# Patient Record
Sex: Male | Born: 1950 | Race: White | Hispanic: No | Smoking: Current every day smoker
Health system: Southern US, Community
[De-identification: ages and names within clinical notes are randomized; demographics above are authoritative.]

## PROBLEM LIST (undated history)

## (undated) HISTORY — PX: CHOLECYSTECTOMY: SHX55

---

## 2014-07-10 ENCOUNTER — Emergency Department (HOSPITAL_COMMUNITY): Payer: Medicaid Other

## 2014-07-10 ENCOUNTER — Emergency Department (HOSPITAL_COMMUNITY)
Admission: EM | Admit: 2014-07-10 | Discharge: 2014-07-10 | Disposition: A | Discharge status: Home / Self Care | Payer: Medicaid Other | Attending: Emergency Medicine | Admitting: Emergency Medicine

## 2014-07-10 ENCOUNTER — Encounter (HOSPITAL_COMMUNITY): Payer: Self-pay

## 2014-07-10 DIAGNOSIS — S0993XA Unspecified injury of face, initial encounter: Secondary | ICD-10-CM | POA: Diagnosis present

## 2014-07-10 DIAGNOSIS — S79912A Unspecified injury of left hip, initial encounter: Secondary | ICD-10-CM | POA: Diagnosis not present

## 2014-07-10 DIAGNOSIS — S59901A Unspecified injury of right elbow, initial encounter: Secondary | ICD-10-CM | POA: Diagnosis not present

## 2014-07-10 DIAGNOSIS — Y9339 Activity, other involving climbing, rappelling and jumping off: Secondary | ICD-10-CM | POA: Diagnosis not present

## 2014-07-10 DIAGNOSIS — S79911A Unspecified injury of right hip, initial encounter: Secondary | ICD-10-CM | POA: Insufficient documentation

## 2014-07-10 DIAGNOSIS — S01511A Laceration without foreign body of lip, initial encounter: Secondary | ICD-10-CM

## 2014-07-10 DIAGNOSIS — S0285XA Fracture of orbit, unspecified, initial encounter for closed fracture: Secondary | ICD-10-CM

## 2014-07-10 DIAGNOSIS — T1490XA Injury, unspecified, initial encounter: Secondary | ICD-10-CM

## 2014-07-10 DIAGNOSIS — S02401A Maxillary fracture, unspecified, initial encounter for closed fracture: Secondary | ICD-10-CM | POA: Diagnosis not present

## 2014-07-10 DIAGNOSIS — W14XXXA Fall from tree, initial encounter: Secondary | ICD-10-CM | POA: Insufficient documentation

## 2014-07-10 DIAGNOSIS — S299XXA Unspecified injury of thorax, initial encounter: Secondary | ICD-10-CM | POA: Diagnosis not present

## 2014-07-10 DIAGNOSIS — S99911A Unspecified injury of right ankle, initial encounter: Secondary | ICD-10-CM | POA: Insufficient documentation

## 2014-07-10 DIAGNOSIS — Y9289 Other specified places as the place of occurrence of the external cause: Secondary | ICD-10-CM | POA: Diagnosis not present

## 2014-07-10 DIAGNOSIS — S023XXA Fracture of orbital floor, initial encounter for closed fracture: Secondary | ICD-10-CM | POA: Diagnosis not present

## 2014-07-10 DIAGNOSIS — S92002A Unspecified fracture of left calcaneus, initial encounter for closed fracture: Secondary | ICD-10-CM

## 2014-07-10 DIAGNOSIS — Z720 Tobacco use: Secondary | ICD-10-CM | POA: Diagnosis not present

## 2014-07-10 DIAGNOSIS — Y990 Civilian activity done for income or pay: Secondary | ICD-10-CM | POA: Diagnosis not present

## 2014-07-10 LAB — COMPREHENSIVE METABOLIC PANEL
ALK PHOS: 107 U/L (ref 39–117)
ALT: 52 U/L (ref 0–53)
AST: 42 U/L — ABNORMAL HIGH (ref 0–37)
Albumin: 3.6 g/dL (ref 3.5–5.2)
Anion gap: 9 (ref 5–15)
BILIRUBIN TOTAL: 0.5 mg/dL (ref 0.3–1.2)
BUN: 12 mg/dL (ref 6–23)
CALCIUM: 8.8 mg/dL (ref 8.4–10.5)
CHLORIDE: 109 mmol/L (ref 96–112)
CO2: 21 mmol/L (ref 19–32)
Creatinine, Ser: 0.85 mg/dL (ref 0.50–1.35)
GLUCOSE: 165 mg/dL — AB (ref 70–99)
Potassium: 4.4 mmol/L (ref 3.5–5.1)
SODIUM: 139 mmol/L (ref 135–145)
Total Protein: 6.8 g/dL (ref 6.0–8.3)

## 2014-07-10 LAB — CBC WITH DIFFERENTIAL/PLATELET
BASOS ABS: 0.1 10*3/uL (ref 0.0–0.1)
Basophils Relative: 0 % (ref 0–1)
EOS PCT: 3 % (ref 0–5)
Eosinophils Absolute: 0.5 10*3/uL (ref 0.0–0.7)
HEMATOCRIT: 45.5 % (ref 39.0–52.0)
Hemoglobin: 15.5 g/dL (ref 13.0–17.0)
LYMPHS ABS: 4 10*3/uL (ref 0.7–4.0)
Lymphocytes Relative: 24 % (ref 12–46)
MCH: 29.4 pg (ref 26.0–34.0)
MCHC: 34.1 g/dL (ref 30.0–36.0)
MCV: 86.3 fL (ref 78.0–100.0)
MONO ABS: 1.3 10*3/uL — AB (ref 0.1–1.0)
Monocytes Relative: 8 % (ref 3–12)
Neutro Abs: 10.4 10*3/uL — ABNORMAL HIGH (ref 1.7–7.7)
Neutrophils Relative %: 65 % (ref 43–77)
Platelets: 290 10*3/uL (ref 150–400)
RBC: 5.27 MIL/uL (ref 4.22–5.81)
RDW: 15 % (ref 11.5–15.5)
WBC: 16.3 10*3/uL — ABNORMAL HIGH (ref 4.0–10.5)

## 2014-07-10 LAB — ABO/RH: ABO/RH(D): A POS

## 2014-07-10 LAB — TYPE AND SCREEN
ABO/RH(D): A POS
Antibody Screen: NEGATIVE

## 2014-07-10 MED ORDER — LIDOCAINE HCL 1 % IJ SOLN
10.0000 mL | Freq: Once | INTRAMUSCULAR | Status: DC
  Filled 2014-07-10: qty 10

## 2014-07-10 MED ORDER — LIDOCAINE HCL (PF) 1 % IJ SOLN
INTRAMUSCULAR | Status: DC
Start: 2014-07-10 — End: 2014-07-10
  Filled 2014-07-10: qty 5

## 2014-07-10 MED ORDER — DOCUSATE SODIUM 100 MG PO CAPS: 100.0000 mg | ORAL_CAPSULE | Freq: Two times a day (BID) | ORAL | Status: AC

## 2014-07-10 MED ORDER — FENTANYL CITRATE 0.05 MG/ML IJ SOLN
100.0000 ug | Freq: Once | INTRAMUSCULAR | Status: AC
  Administered 2014-07-10: 100 ug via INTRAVENOUS
  Filled 2014-07-10: qty 2

## 2014-07-10 MED ORDER — NAPROXEN 500 MG PO TABS: 500.0000 mg | ORAL_TABLET | Freq: Two times a day (BID) | ORAL | Status: AC

## 2014-07-10 MED ORDER — OXYCODONE-ACETAMINOPHEN 5-325 MG PO TABS: 1.0000 | ORAL_TABLET | ORAL | Status: AC | PRN

## 2014-07-10 MED ORDER — CEPHALEXIN 500 MG PO CAPS: 500.0000 mg | ORAL_CAPSULE | Freq: Three times a day (TID) | ORAL | Status: AC

## 2014-07-10 MED ORDER — FENTANYL CITRATE 0.05 MG/ML IJ SOLN
100.0000 ug | INTRAMUSCULAR | Status: AC
  Administered 2014-07-10: 100 ug via INTRAVENOUS
  Filled 2014-07-10: qty 2

## 2014-07-10 NOTE — ED Notes (Signed)
Ortho at bedside to consult, to be splint placed to L lower extremity.

## 2014-07-10 NOTE — ED Notes (Signed)
Pt. Attempting to use urinal, unsuccessful at this time

## 2014-07-10 NOTE — ED Notes (Signed)
Ortho at bedside.
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
## 2014-07-10 NOTE — Progress Notes (Signed)
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  Patient indicated that he climbed tree without securing safety line he  cut out and fell. Patient is alert .  Patient ask that I call a friend Verline Lema( David Luck [phone]) and inform him that he was here in ED.  Call was made and friend is in route to hospital. Provided emotional support and information sharing between staff and patient.  Will follow as needed.   07/10/14 1000  Clinical Encounter Type  Visited With Patient;Health care provider  Visit Type Initial;Spiritual support;ED;Trauma  Referral From Nurse  Spiritual Encounters  Spiritual Needs Emotional  Stress Factors  Patient Stress Factors None identified  Venida JarvisWatlington, Leodis Alcocer, Chaplain,pager [phone]
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
## 2014-07-10 NOTE — Progress Notes (Signed)
Orthopedic Tech Progress Note Patient Details:  Frank Snow 03/24/1951 [id number]  Ortho Devices Type of Ortho Device: Ace wrap, Post (short leg) splint, Stirrup splint, Crutches Ortho Device/Splint Location: LLE Ortho Device/Splint Interventions: Ordered, Application   Jennye MoccasinHughes, Frank Snow 07/10/2014, 3:45 PM

## 2014-07-10 NOTE — ED Notes (Signed)
Pt placed in gown, on continuous pulse oximetry and blood pressure cuff; warm blankets given; Aspen collar applied and LSB removed by Hyacinth MeekerMiller, MD

## 2014-07-10 NOTE — Discharge Instructions (Signed)
Please call your doctor for a followup appointment within 24-48 hours. When you talk to your doctor please let them know that you were seen in the emergency department and have them acquire all of your records so that they can discuss the findings with you and formulate a treatment plan to fully care for your new and ongoing problems. ° ° °Emergency Department Resource Guide °1) Find a Doctor and Pay Out of Pocket °Although you won't have to find out who is covered by your insurance plan, it is a good idea to ask around and get recommendations. You will then need to call the office and see if the doctor you have chosen will accept you as a new patient and what types of options they offer for patients who are self-pay. Some doctors offer discounts or will set up payment plans for their patients who do not have insurance, but you will need to ask so you aren't surprised when you get to your appointment. ° °2) Contact Your Local Health Department °Not all health departments have doctors that can see patients for sick visits, but many do, so it is worth a call to see if yours does. If you don't know where your local health department is, you can check in your phone book. The CDC also has a tool to help you locate your state's health department, and many state websites also have listings of all of their local health departments. ° °3) Find a Walk-in Clinic °If your illness is not likely to be very severe or complicated, you may want to try a walk in clinic. These are popping up all over the country in pharmacies, drugstores, and shopping centers. They're usually staffed by nurse practitioners or physician assistants that have been trained to treat common illnesses and complaints. They're usually fairly quick and inexpensive. However, if you have serious medical issues or chronic medical problems, these are probably not your best option. ° °No Primary Care Doctor: °- Call [facility] at  832-8000 - they can help you  locate a primary care doctor that  accepts your insurance, provides certain services, etc. °- Physician Referral Service- 1-800-533-3463 ° °Chronic Pain Problems: °Organization         Address  Phone   Notes  °[facility]  (336) 297-2271 Patients need to be referred by their primary care doctor.  ° °Medication Assistance: °Organization         Address  Phone   Notes  °[place] [facility] [address] °[place], [place] [address] (336) 641-8030 --Must be a resident of [place] °-- Must have NO insurance coverage whatsoever (no Medicaid/ Medicare, etc.) °-- The pt. MUST have a primary care doctor that directs their care regularly and follows them in the community °  °[facility]  (866) 331-1348   °[employer]  (888) 892-1162   ° °Agencies that provide inexpensive medical care: °Organization         Address  Phone   Notes  °[facility]  (336) 832-8035   °[facility]    (336) 832-7272   °[facility] [address] °[place], [place] [address] (336) 832-4777   °[facility] [address], °[place] (336) 271-4999   °[facility]    (336) 373-0678   °[facility]    (336) 272-1050   °[facility] ° [address], [place] Phone:  (336)   832-4444, Fax:  (336) 832-4440 Hours of Operation:  9 am - 6 pm, M-F.  Also accepts Medicaid/Medicare and self-pay.  °[facility] ° [address], [place] Phone: (336) 832-3150, Fax: (336) 832-3151. Hours of Operation:  8:30 am - 5:30 pm, M-F.  Also accepts Medicaid and self-pay.  °[facility] [place] [address], [place] Phone: (336) 878-6027   °[facility] [address], [place], [place] (336)723-1848, Ext. 123 Mondays & Thursdays: 7-9 AM.  First 15 patients are seen on a first come, first serve basis. °  ° °Medicaid-accepting [place]  Providers: ° °Organization         Address  Phone   Notes  °[facility] [address], [place] (336) 641-2100 Also accepts self-pay patients.  °[facility] [address], [place] ° (336) 856-9996   °[facility] [address], [place] (336) 288-8857   °[facility] [address] [place] [address], [place] (336) 299-7000   °Veita Bland [address], [place]  ° (336) 373-1557 Only accepts [place] Access Medicaid patients after they have their name applied to their card.  ° °Self-Pay (no insurance) in [place]: ° °Organization         Address  Phone   Notes  °Sickle Cell Patients, [facility] [address], [place] (336) 832-1970   °[facility] [address], [place] (336) 832-4400   °[facility] ° [address], [place] (336) 992-4800   °[facility]/Dr. Osei-Bonsu ° [address] [place] [address], [place] or [address], [place] (336) [phone] Phone number for both [place] and [place] locations is the same.  °[facility] [address], [place] (336) 299-0000   °[facility] [address] [place] [address], [place] or [address] (336) 852-7530 °(336) 878-2260   °[facility] [address], [place] (336) 350-1642, phone; (336) 294-5005, fax Sees patients 1st and 3rd Saturday of every month.  Must not qualify for public or private insurance (i.e. Medicaid, Medicare, [place] Health Choice, Veterans' Benefits) • Household income should be no more than 200% of the poverty level •The clinic cannot treat you if you are pregnant or think you are pregnant • Sexually transmitted diseases are not treated at the clinic.  ° ° °Dental Care: °Organization         Address  Phone  Notes  °[facility] [address], [place] (336) 641-6152 Accepts children up to age 21 who are enrolled in Medicaid or [place] Health Choice; pregnant women with a Medicaid card; and children who have applied for Medicaid or [place] Health Choice, but were declined, whose parents can pay a reduced fee at time of service.  °[facility] [place]  [address], [place] (336) 641-7733 Accepts children up to age 21 who are enrolled in Medicaid or [place] Health Choice; pregnant women with a Medicaid card; and children who have applied for Medicaid or [place] Health Choice, but were declined, whose parents can pay a reduced fee at time of service.  °[facility] ° [address], [place] (336) 641-4533 Patients are seen by appointment only. Walk-ins are   not accepted. [facility] will see patients 18 years of age and older. °Monday - Tuesday (8am-5pm) °Most Wednesdays (8:30-5pm) °$30 per visit, cash only  °[facility] ° [address], [place] (336) 641-4533 Patients are seen by appointment only. Walk-ins are not accepted. [facility] will see patients 18 years of age and older. °One Wednesday Evening (Monthly: Volunteer Based).  $30 per visit, cash only  °[facility]  (919) 537-3737 for adults; Children under age 4, call Graduate Pediatric Dentistry at ([phone]) [phone]. Children aged 4-14, please call ([phone]) [phone] to request a pediatric application. ° Dental services are provided in all areas of dental care including fillings, crowns and bridges, complete and partial dentures, implants, gum treatment, root canals, and extractions. Preventive care is also provided. Treatment is provided to both adults and children. °Patients are selected via a lottery and there is often a waiting list. °  °[facility] [address], °[place] ° (336) 763-8833 [email or web address] °  °[facility]  [address], [place], [place] (336)723-1848, Ext. 123 Second and Fourth Thursday of each month, opens at 6:30 AM; Clinic ends at 9 AM.  Patients are seen on a first-come first-served basis, and a limited number are seen during each clinic.  ° °[facility] ° [address], [place], [place] (336) 723-7904   Eligibility Requirements °You must have lived in [place], [place], or [place] counties for at least the last three months. °  You cannot be eligible for state or federal sponsored healthcare insurance, including Veterans Administration, Medicaid, or Medicare. °  You generally cannot be eligible for healthcare insurance through your employer.  °  How to apply: °Eligibility screenings are held every Tuesday and Wednesday afternoon from 1:00 pm until 4:00 pm. You do not need an appointment for the interview!  °[facility] [address], [place], [place] 336-631-2330   °[facility]  336-342-8273   °[facility]  336-703-3100   °[facility]  336-570-6415   ° °Behavioral Health Resources in the Community: °Intensive Outpatient Programs °Organization         Address  Phone  Notes  °[facility] [address], [place], [place] 336-878-6098   °[facility] [address], [place], [place] 336-832-9800   °[facility] [address], [place], [place] ° 336-882-2125   °[facility] [address],  °[place], [place] 1-800-853-5163 or 336-641-4981   °Substance Abuse Resources °Organization         Address  Phone  Notes  °[facility]  336-882-2125   °[facility]  336-784-9470   °[facility]  336-285-9073   °[facility]  336-845-3988   °Residential & Outpatient Substance Abuse Program  1-800-659-3381   °Psychological Services °Organization         Address  Phone  Notes  °[facility]  336- 832-9600     °[facility]  336- 378-7881   °[facility] [address], [place] 1-800-853-5163 or 336-641-4981   ° °Mobile Crisis Teams °Organization         Address  Phone  Notes  °[facility], Mobile Crisis Care Unit  1-877-626-1772   °Assertive °[facility] ° [address] [place], [place] 336-834-9664   °Sharon DeEsch [address] °[place] 336-554-5454   ° °  Self-Help/Support Groups °Organization         Address  Phone             Notes  °[facility] - variety of support groups  336- 373-1402 Call for more information  °Narcotics Anonymous (NA), [facility] [address], °[place]  2 meetings at this location  ° °Residential Treatment Programs °Organization         Address  Phone  Notes  °[facility] [address],    °[place]  1-866-801-8205   °[facility] ° [address], [place], [place] 704-293-8524   °[facility] [address], [place] 336-845-3988 Admissions: 8am-3pm M-F  °[facility] [address]    °[place], [place] 336-841-1104   °[facility] [address], [place], [place] 336-379-7146   °[facility] [address]  °[place], [place] 336-285-9073   °[facility] Intensive Outpatient [address], [place], [place] 336-852-3033   °[facility] [address]  °[place], [place] 1-877-615-2722 or 336-784-9470   °[facility] [address] [place], [place] 336-227-7417 Accepts Medicaid  °[facility] [address]  °[place] 1-800-659-3381 Substance Abuse/Addiction Treatment  ° °[place] Behavioral Health Resources °Organization         Address  Phone  Notes  °[facility]  (888) 581-9988   °Julie Brannon, PhD [address] [place], [place]   (336) 349-5553 or (336) 951-0000    °[facility]   [address] °[place], [place] (336) 349-4454   °[facility] [address], [place], [place] (336) 342-8316 Insurance/Medicaid/sponsorship through [facility]  °[facility] [address]                                    [place], [place] (336) 342-8316 Therapy/tele-psych/case  °[facility] [address]  ° [place], [place] (336) 349-2233    °Dr. Arfeen  (336) 349-4544   °[facility] [place]  [employer] [place] [facility] 1) [address],  °2) [address], [place] °3)  [address], [place] (336) 349-3220 °(336) 342-7768 ° °(336) 342-8140   °[place] [facility] (336) 342-1394 or (336) 342-3537 (After Hours)    ° ° ° °

## 2015-12-03 IMAGING — CT CT HEAD W/O CM
5 of 9 series · 16 of 47 positions shown, 18 images · non-contrast
Comparison: Head CT of 09/24/2012 and 11/25/2005

CLINICAL DATA: Initial encounter for fall from tree. Abrasions to
face. Loss consciousness. Right face/mandible pain.

EXAM:
CT HEAD WITHOUT CONTRAST
CT MAXILLOFACIAL WITHOUT CONTRAST
CT CERVICAL SPINE WITHOUT CONTRAST
TECHNIQUE: Multidetector CT imaging of the head, cervical spine, and
maxillofacial structures were performed using the standard protocol
without intravenous contrast. Multiplanar CT image reconstructions
of the cervical spine and maxillofacial structures were also
generated.

[Series 4: head 2.0 h70h · axial · 0.48mm/px · z∈[+1107,+1219]mm · 5 of 85 slices shown, 7 images]
[im 15/85  brain]
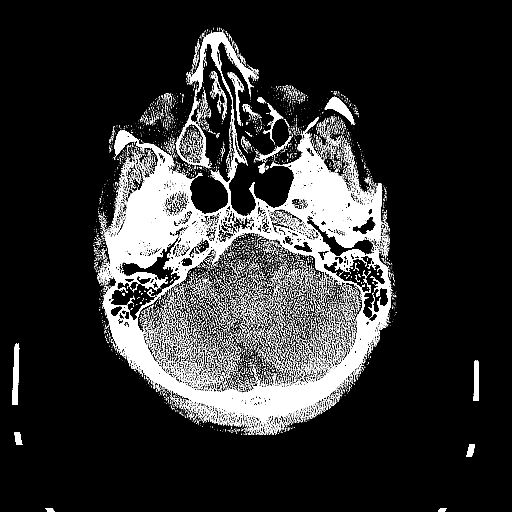
[im 15/85  bone]
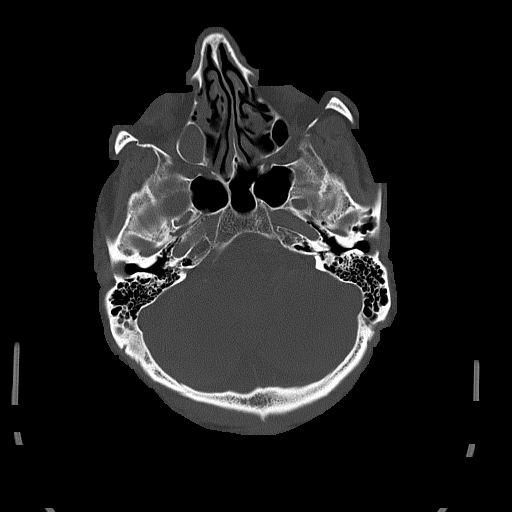
[im 29/85  brain]
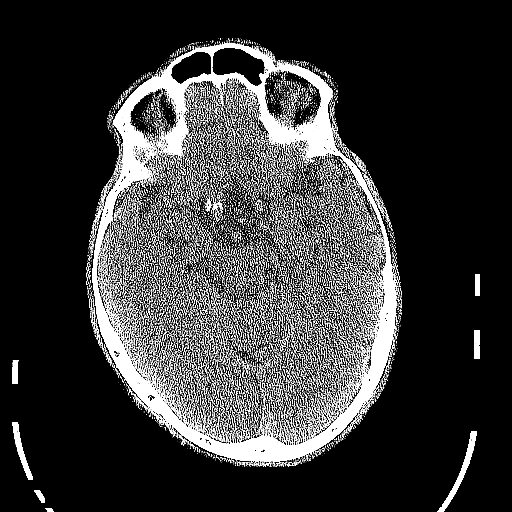
[im 43/85  brain]
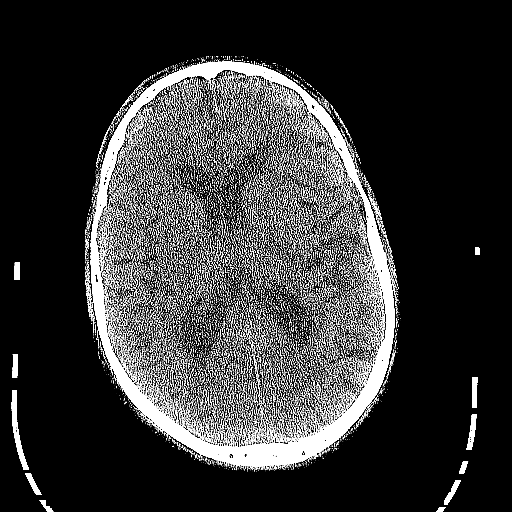
[im 57/85  brain]
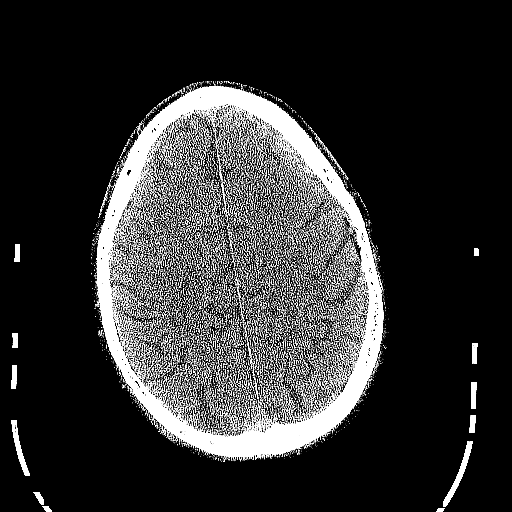
[im 71/85  brain]
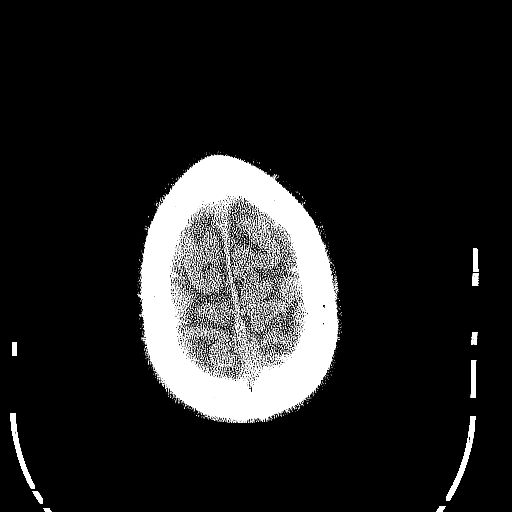
[im 71/85  bone]
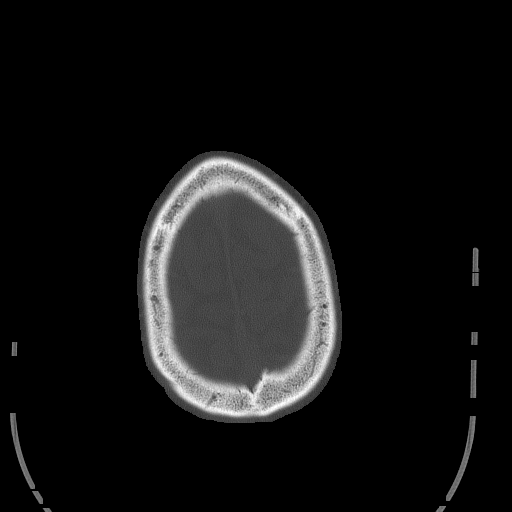

[Series 5: facial/ orbits 2.0 h30s · axial · 0.38mm/px · z∈[+1008,+1120]mm · 5 of 85 slices shown]
[im 15/85  brain]
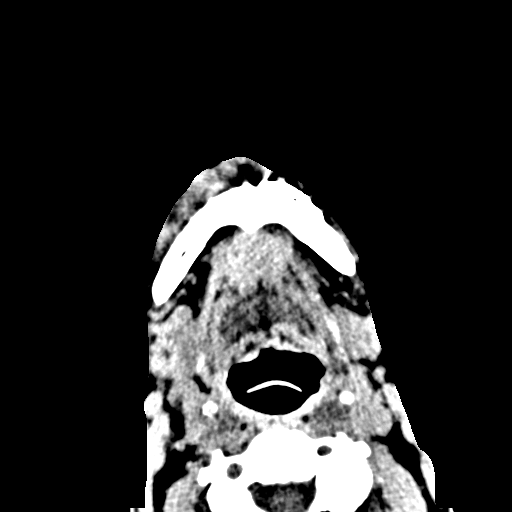
[im 29/85  brain]
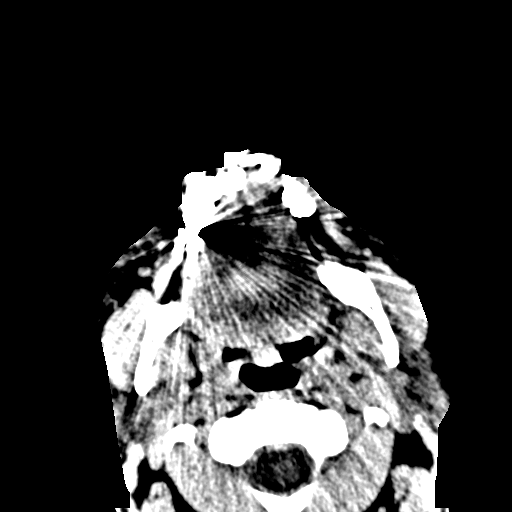
[im 43/85  brain]
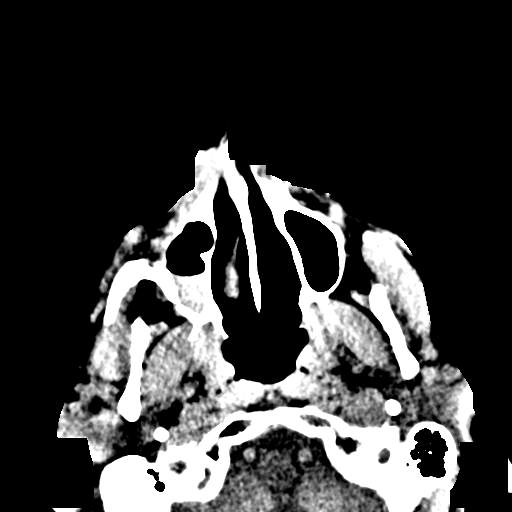
[im 57/85  brain]
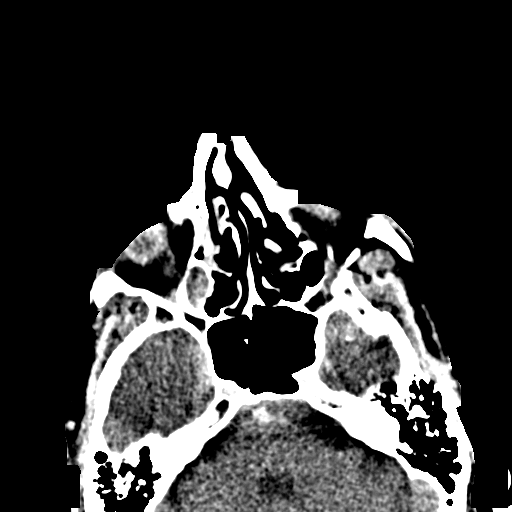
[im 71/85  brain]
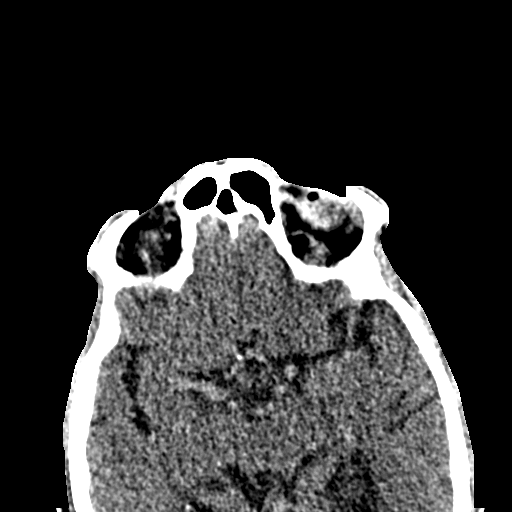

[Series 605: sagittal bone · sagittal · 0.38mm/px · 2 of 86 slices shown]
[im 29/86  brain]
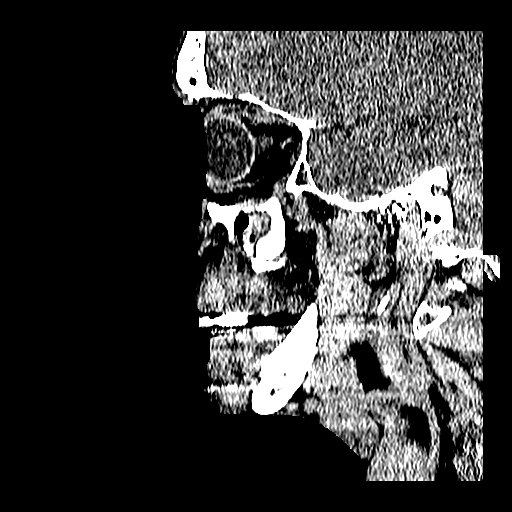
[im 57/86  brain]
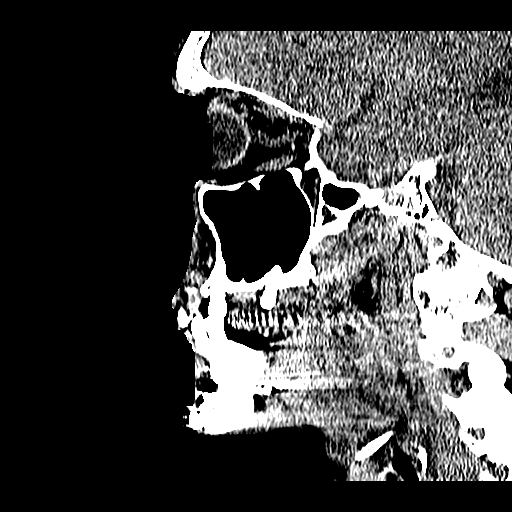

[Series 607: coronal · coronal · 0.51mm/px · 2 of 51 slices shown]
[im 17/51  brain]
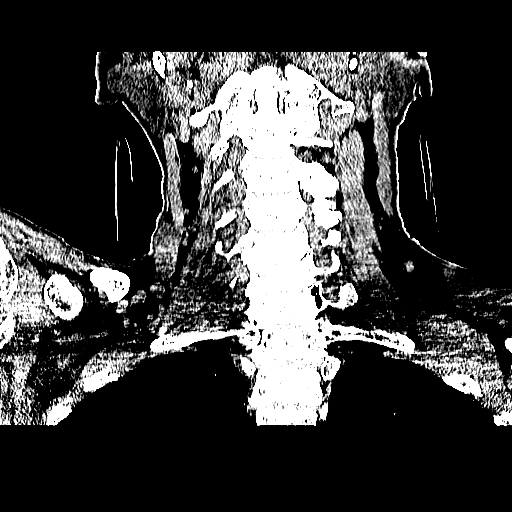
[im 34/51  brain]
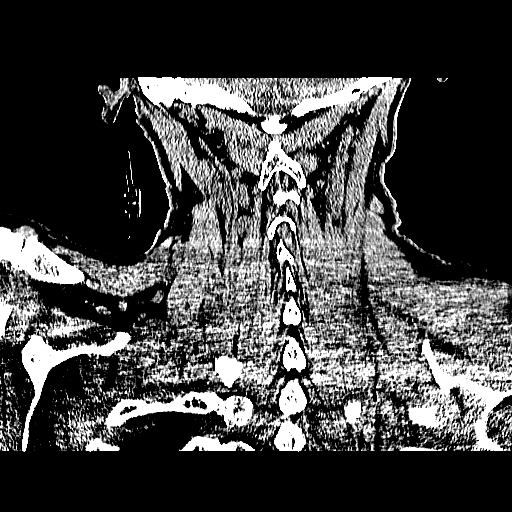

[Series 608: orthogonal · axial · 0.51mm/px · z∈[+884,+915]mm · 2 of 94 slices shown]
[im 16/94  brain]
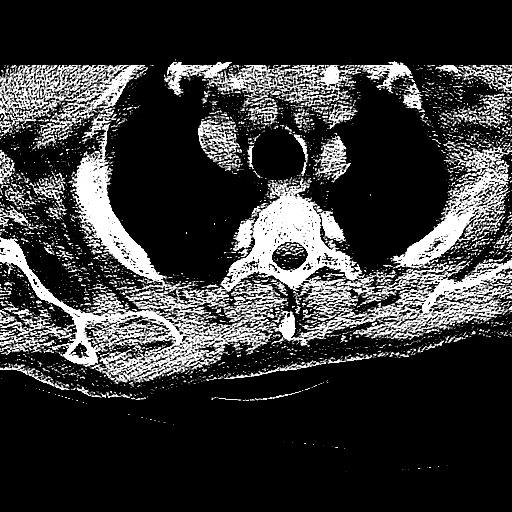
[im 32/94  brain]
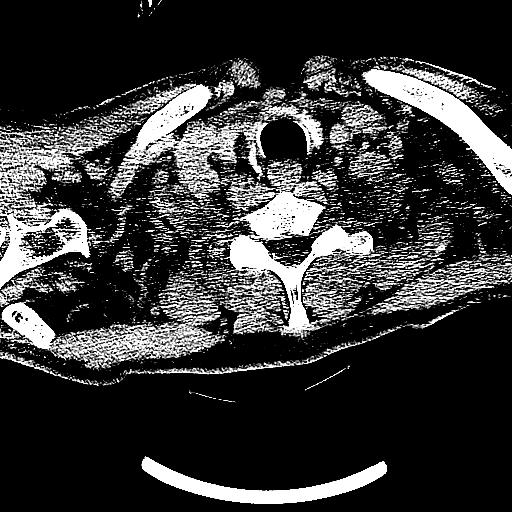

[16 of 47 positions shown; findings below may reference images not displayed]

FINDINGS: CT HEAD FINDINGS

Sinuses/Soft tissues: Right facial fractures with maxillary sinus
hemorrhage. This would be detail below. No extension into this skull
base. Minimal right mastoid fluid, similar.

Intracranial: Moderate low density in the periventricular white
matter likely related to small vessel disease. No mass lesion,
hemorrhage, hydrocephalus, acute infarct, intra-axial, or
extra-axial fluid collection.

CT MAXILLOFACIAL FINDINGS

Soft tissues: Soft tissue swelling about the right maxillary sinus
and right zygoma. Normal appearance of the orbits and globes,
without retrobulbar hemorrhage.

Bones: Surgical changes about the mandible bilaterally. Mild motion
degradation.

Similar mild irregularity of the right zygomatic arch, without
overlying soft tissue swelling. Likely within normal variation or
related to remote trauma. Both mandibular condyles are located.

Fractures of the posterior lateral and antro lateral walls of the
right maxillary sinus. Hemorrhage within the sinus. Minimal air
within the surrounding soft tissues. Fracture line extends into the
superior aspect of the lateral wall right orbit, including on image
65 of series 6.

Coronal reformats demonstrate complex fracture of the right orbital
floor. No involvement of the inferior rectus.

CT CERVICAL SPINE FINDINGS

Spinal visualization through the bottom of T2. Prevertebral soft
tissues are within normal limits. No apical pneumothorax. Multilevel
spondylosis. This results in areas of bilateral neural foraminal
narrowing and central canal stenosis. Central canal stenosis
primarily at C5-6.

Skull base intact. Maintenance of vertebral body height.
Straightening of expected lordosis. Loss of intervertebral disc
height at multiple levels from C5 through T1. Facet arthropathy is
advanced on the right at C2-3 and on the left at C3-4. Coronal
reformats demonstrate a normal C1-C2 articulation..
IMPRESSION: 1. Extensive right-sided facial fractures with resultant maxillary
sinus hemorrhage.
2.  No acute intracranial abnormality.
3. Cervical spondylosis, without acute fracture or subluxation.
4. Straightening of expected cervical lordosis could be positional,
due to muscular spasm, or ligamentous injury.
5. Small chronic right mastoid effusion.
6. Moderate small vessel ischemic change.

## 2015-12-03 IMAGING — CR DG KNEE 1-2V*L*
2 series · 2 of 2 positions shown · non-contrast
Comparison: None.

CLINICAL DATA: Patient fell from tree.  Pain

EXAM:
LEFT KNEE - 1-2 VIEW

[x knee ap left]
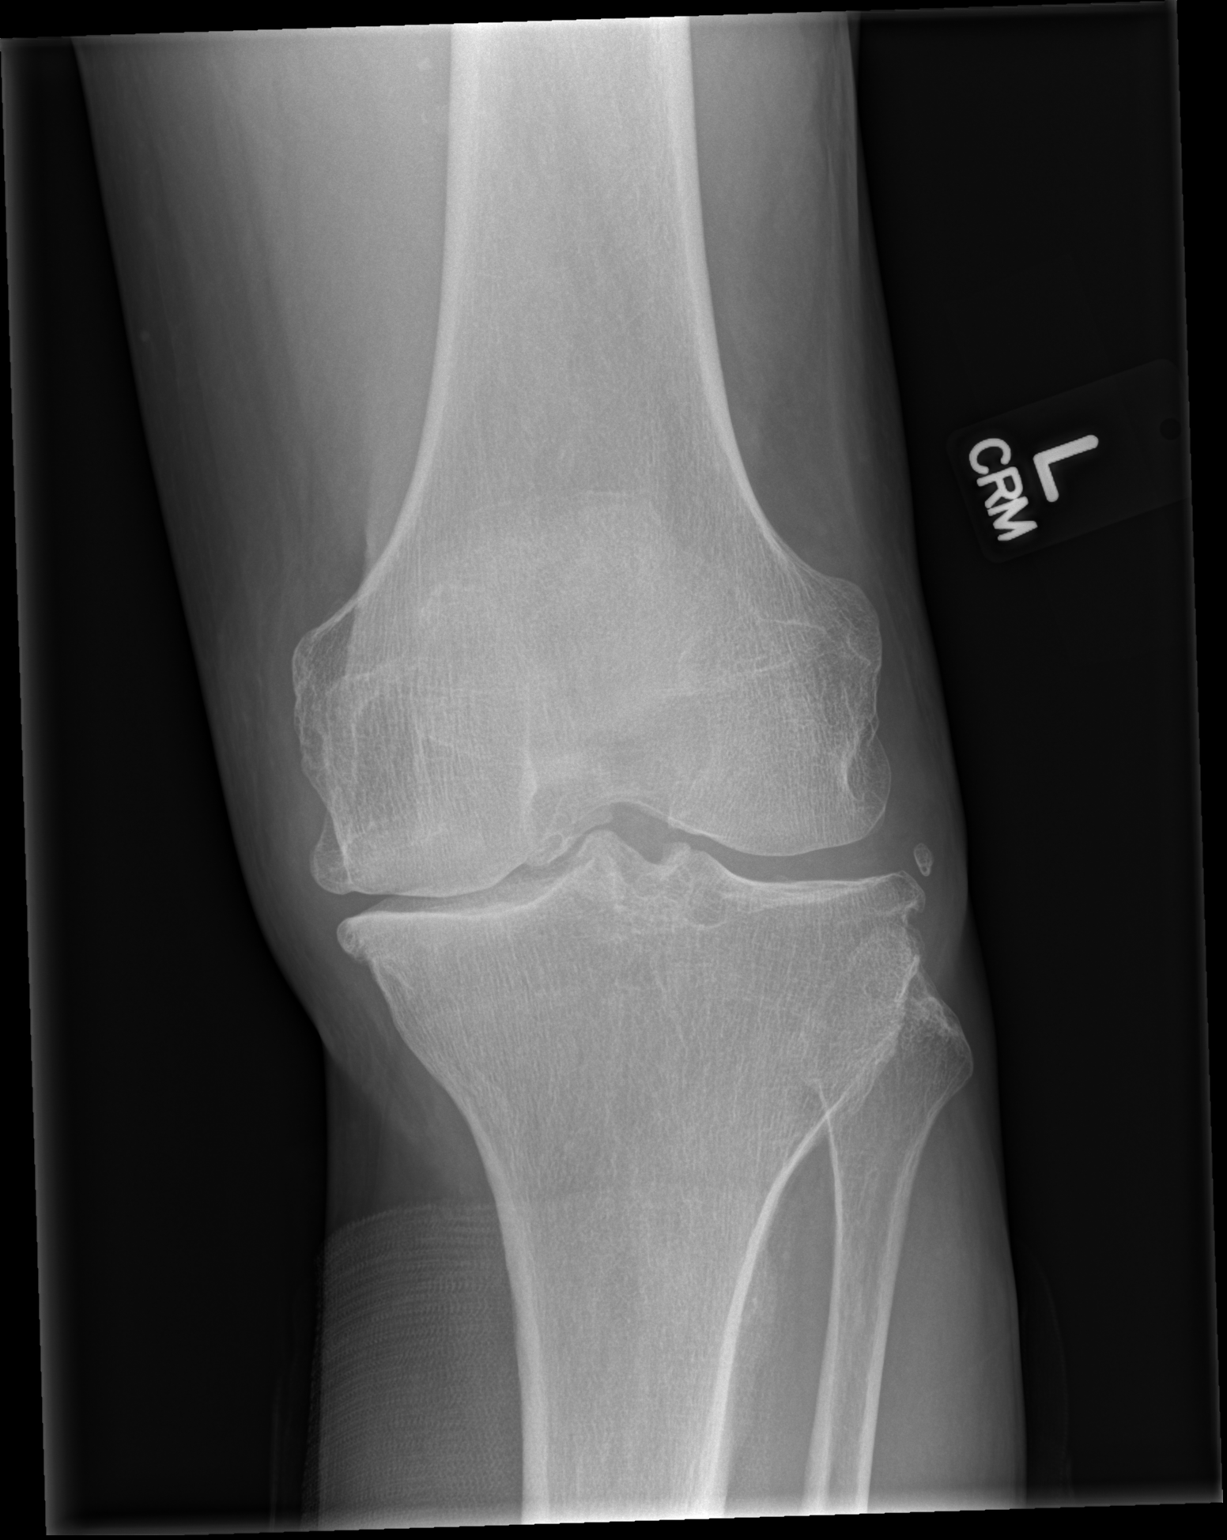

[x knee patella left]
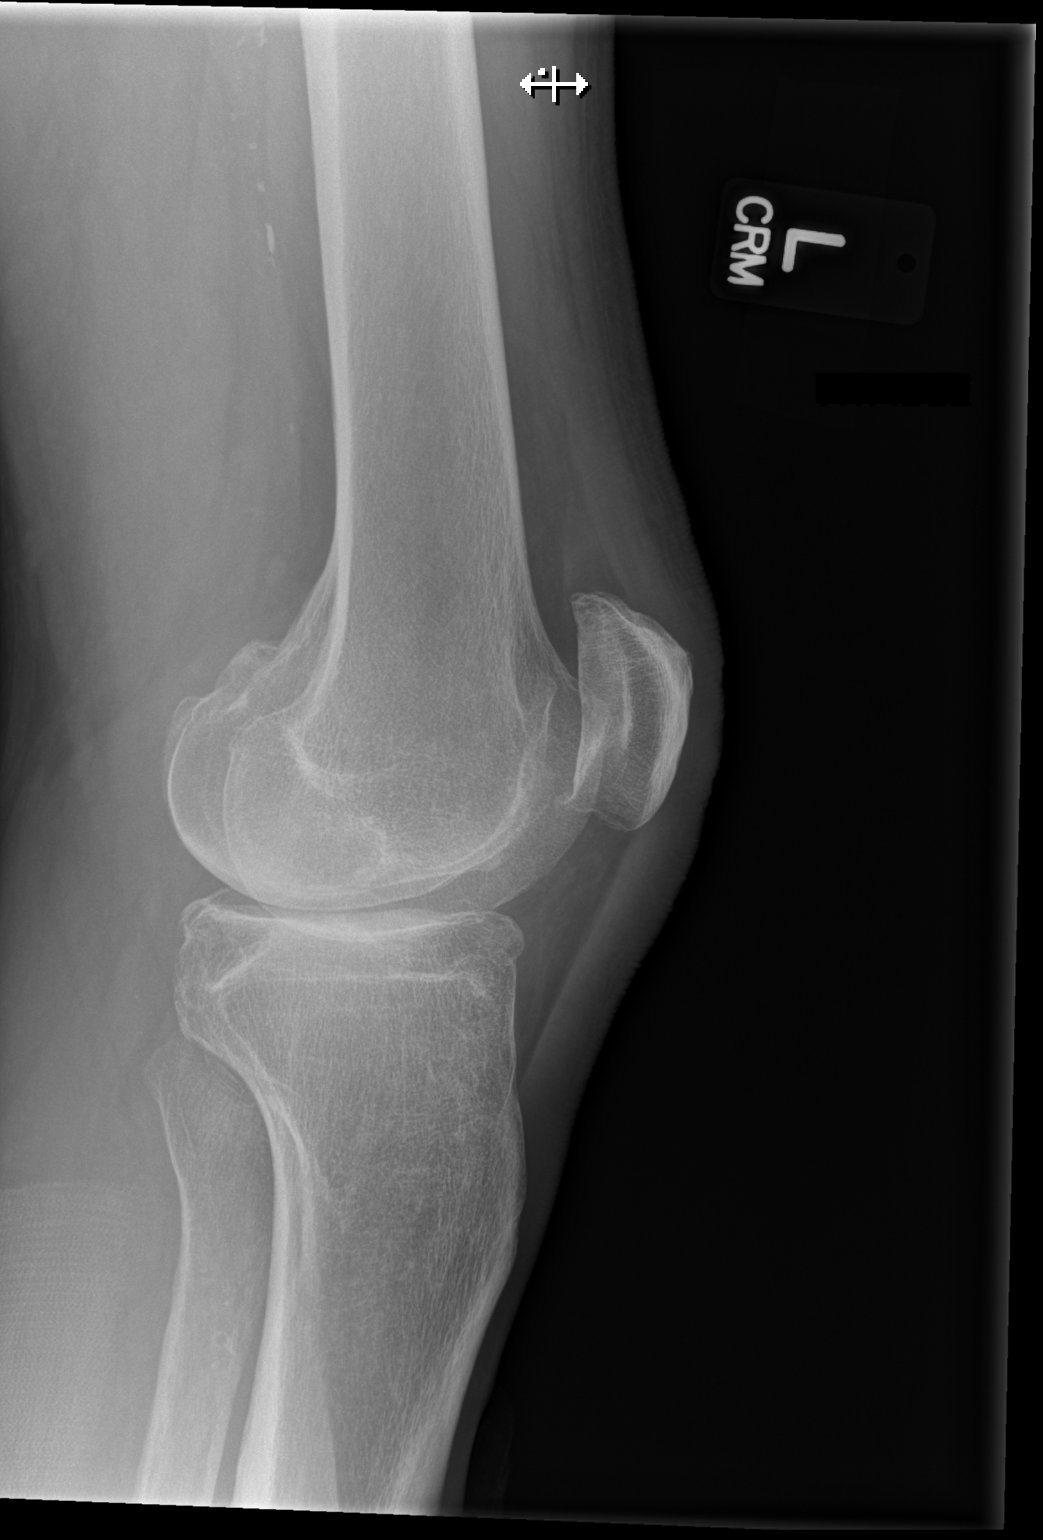

[2 of 2 positions shown; findings below may reference images not displayed]

FINDINGS: Frontal and lateral views were obtained. There is no fracture,
dislocation, or effusion. There is marked narrowing medially. There
is mild patellofemoral joint narrowing. There is spurring in all
compartments. No erosive change.
IMPRESSION: Osteoarthritic change, most marked medially. No fracture or
dislocation. No joint effusion.

## 2023-06-02 DEATH — deceased
